# Patient Record
Sex: Female | Born: 1995 | Race: White | Hispanic: No | Marital: Single | Smoking: Never smoker
Health system: Southern US, Community
[De-identification: ages and names within clinical notes are randomized; demographics above are authoritative.]

## PROBLEM LIST (undated history)

## (undated) DIAGNOSIS — D649 Anemia, unspecified: Secondary | ICD-10-CM

## (undated) DIAGNOSIS — Z789 Other specified health status: Secondary | ICD-10-CM

---
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
## 2014-09-26 ENCOUNTER — Emergency Department: Payer: BC Managed Care – PPO

## 2014-09-26 ENCOUNTER — Encounter: Payer: Self-pay | Admitting: Emergency Medicine

## 2014-09-26 ENCOUNTER — Emergency Department
Admission: EM | Admit: 2014-09-26 | Discharge: 2014-09-26 | Disposition: A | Discharge status: Home / Self Care | Payer: BC Managed Care – PPO | Attending: Emergency Medicine | Admitting: Emergency Medicine

## 2014-09-26 DIAGNOSIS — Z79899 Other long term (current) drug therapy: Secondary | ICD-10-CM | POA: Diagnosis not present

## 2014-09-26 DIAGNOSIS — E876 Hypokalemia: Secondary | ICD-10-CM | POA: Diagnosis not present

## 2014-09-26 DIAGNOSIS — Z3202 Encounter for pregnancy test, result negative: Secondary | ICD-10-CM | POA: Insufficient documentation

## 2014-09-26 DIAGNOSIS — R1031 Right lower quadrant pain: Secondary | ICD-10-CM

## 2014-09-26 HISTORY — DX: Anemia, unspecified: D64.9

## 2014-09-26 LAB — CBC WITH DIFFERENTIAL/PLATELET
BASOS ABS: 0 10*3/uL (ref 0–0.1)
Basophils Relative: 0 %
EOS PCT: 1 %
Eosinophils Absolute: 0.2 10*3/uL (ref 0–0.7)
HEMATOCRIT: 38.4 % (ref 35.0–47.0)
HEMOGLOBIN: 12.9 g/dL (ref 12.0–16.0)
LYMPHS ABS: 6.2 10*3/uL — AB (ref 1.0–3.6)
LYMPHS PCT: 50 %
MCH: 30.8 pg (ref 26.0–34.0)
MCHC: 33.7 g/dL (ref 32.0–36.0)
MCV: 91.3 fL (ref 80.0–100.0)
Monocytes Absolute: 0.8 10*3/uL (ref 0.2–0.9)
Monocytes Relative: 7 %
NEUTROS ABS: 5.4 10*3/uL (ref 1.4–6.5)
NEUTROS PCT: 42 %
PLATELETS: 217 10*3/uL (ref 150–440)
RBC: 4.21 MIL/uL (ref 3.80–5.20)
RDW: 12.9 % (ref 11.5–14.5)
WBC: 12.6 10*3/uL — AB (ref 3.6–11.0)

## 2014-09-26 LAB — BASIC METABOLIC PANEL
ANION GAP: 7 (ref 5–15)
BUN: 9 mg/dL (ref 6–20)
CHLORIDE: 105 mmol/L (ref 101–111)
CO2: 25 mmol/L (ref 22–32)
Calcium: 8.5 mg/dL — ABNORMAL LOW (ref 8.9–10.3)
Creatinine, Ser: 0.58 mg/dL (ref 0.44–1.00)
GFR calc Af Amer: >60 mL/min (ref >60)
GLUCOSE: 142 mg/dL — AB (ref 65–99)
POTASSIUM: 3 mmol/L — AB (ref 3.5–5.1)
Sodium: 137 mmol/L (ref 135–145)

## 2014-09-26 LAB — HEPATIC FUNCTION PANEL
ALK PHOS: 56 U/L (ref 38–126)
ALT: 10 U/L — ABNORMAL LOW (ref 14–54)
AST: 22 U/L (ref 15–41)
Albumin: 4.5 g/dL (ref 3.5–5.0)
BILIRUBIN TOTAL: 0.6 mg/dL (ref 0.3–1.2)
Bilirubin, Direct: <0.1 mg/dL — ABNORMAL LOW (ref 0.1–0.5)
TOTAL PROTEIN: 7 g/dL (ref 6.5–8.1)

## 2014-09-26 LAB — URINALYSIS COMPLETE WITH MICROSCOPIC (ARMC ONLY)
BILIRUBIN URINE: NEGATIVE
Glucose, UA: NEGATIVE mg/dL
KETONES UR: NEGATIVE mg/dL
LEUKOCYTES UA: NEGATIVE
Nitrite: NEGATIVE
PH: 5 (ref 5.0–8.0)
PROTEIN: NEGATIVE mg/dL
Specific Gravity, Urine: 1.016 (ref 1.005–1.030)

## 2014-09-26 LAB — LIPASE, BLOOD: Lipase: 25 U/L (ref 22–51)

## 2014-09-26 LAB — POCT PREGNANCY, URINE: PREG TEST UR: NEGATIVE

## 2014-09-26 MED ORDER — IOHEXOL 300 MG/ML  SOLN
100.0000 mL | Freq: Once | INTRAMUSCULAR | Status: AC | PRN
  Administered 2014-09-26: 100 mL via INTRAVENOUS

## 2014-09-26 MED ORDER — IOHEXOL 240 MG/ML SOLN
25.0000 mL | INTRAMUSCULAR | Status: AC
  Administered 2014-09-26: 25 mL via ORAL

## 2014-09-26 MED ORDER — HYDROMORPHONE HCL 1 MG/ML IJ SOLN
0.5000 mg | Freq: Once | INTRAMUSCULAR | Status: AC
  Administered 2014-09-26: 0.5 mg via INTRAVENOUS
  Filled 2014-09-26: qty 1

## 2014-09-26 MED ORDER — ONDANSETRON HCL 4 MG/2ML IJ SOLN
INTRAMUSCULAR | Status: AC
Start: 2014-09-26 — End: 2014-09-26
  Administered 2014-09-26: 4 mg via INTRAVENOUS
  Filled 2014-09-26: qty 2

## 2014-09-26 MED ORDER — ONDANSETRON HCL 4 MG/2ML IJ SOLN
4.0000 mg | Freq: Once | INTRAMUSCULAR | Status: AC
  Administered 2014-09-26: 4 mg via INTRAVENOUS

## 2014-09-26 MED ORDER — SODIUM CHLORIDE 0.9 % IV BOLUS (SEPSIS)
1000.0000 mL | Freq: Once | INTRAVENOUS | Status: AC
  Administered 2014-09-26: 1000 mL via INTRAVENOUS

## 2014-09-26 MED ORDER — MORPHINE SULFATE (PF) 2 MG/ML IV SOLN
INTRAVENOUS | Status: AC
  Administered 2014-09-26: 2 mg via INTRAVENOUS
  Filled 2014-09-26: qty 1

## 2014-09-26 MED ORDER — ONDANSETRON HCL 4 MG/2ML IJ SOLN
4.0000 mg | Freq: Once | INTRAMUSCULAR | Status: AC
  Administered 2014-09-26: 4 mg via INTRAVENOUS
  Filled 2014-09-26: qty 2

## 2014-09-26 MED ORDER — MORPHINE SULFATE (PF) 2 MG/ML IV SOLN
2.0000 mg | Freq: Once | INTRAVENOUS | Status: AC
  Administered 2014-09-26: 2 mg via INTRAVENOUS

## 2014-09-26 MED ORDER — MORPHINE SULFATE (PF) 4 MG/ML IV SOLN: 4.0000 mg | Freq: Once | INTRAVENOUS | Status: DC

## 2014-09-26 NOTE — ED Notes (Signed)
Patient observed resting in bed; sleeping with even and non-labored respirations. Mother at bedside and reports that patient is "much more comfortable since you gave her that medication". Patient appears to be resting comfortable; easy to arouse to light verbal stimulation, however quickly falls back asleep; unable to report pain scale at this time due to somnolence. MD made aware. Patient pending evaluation by EDP. Will continue to monitor.

## 2014-09-26 NOTE — ED Notes (Signed)
Pt presents to ED with sudden onset of right lower abd pain since around 3am. Vomiting X1 while in ED waiting room. Pt appears pale and uncomfortable during triage.

## 2014-09-26 NOTE — ED Provider Notes (Signed)
IMPRESSION: Somewhat complex appearance of the right ovary with slight right periovarian edema. This appearance raises concern for possible early right ovarian torsion. Ultrasound of the pelvis with Doppler evaluation advised in this regard.  Prominent vascular structures are noted in the pelvis bilaterally. Question a degree of pelvic congestion syndrome.  Endometrium appears mildly prominent. Pelvic ultrasound also advised to assess endometrium.  Appendix appears normal. No bowel obstruction. No abscess. No renal or ureteral calculus. No hydronephrosis.  Oral contrast in the distal esophagus raises question of a degree of gastroesophageal reflux.  Critical Value/emergent results were called by telephone at the time of interpretation on 09/26/2014 at 8:47 am to Dr. Kennon Portela , who verbally acknowledged these results.  I assumed care from Dr. Dolores Frame at approximately 8:15 AM.  ----------------------------------------- 8:53 AM on 09/26/2014 -----------------------------------------  On reevaluation the patient reports she has no pain or symptoms currently. I did discuss with her and her mother concerns for possible twisting of the ovary as based on CT, and I have ordered an emergent and discussed with ultrasound and an emergent ultrasound to evaluate for torsion of the right ovary. It is reassuring that her pain is improved now, but we still will require further evaluation for possible torsion. The patient does report she had rather sudden onset severe pain in the right lower quadrant starting at 3 AM, and this history is concerning for possibility of acute torsion. She denies any fevers or chills. No chest pain. No shortness of breath. She states she is currently not in a pain. No vaginal bleeding or discharge.  CLINICAL DATA: Abdominal pain, vomiting, right lower quadrant pain, evaluate for ovarian torsion.  EXAM: TRANSABDOMINAL ULTRASOUND OF PELVIS  DOPPLER ULTRASOUND OF  OVARIES  TECHNIQUE: Transabdominal ultrasound examination of the pelvis was performed including evaluation of the uterus, ovaries, adnexal regions, and pelvic cul-de-sac.  Color and duplex Doppler ultrasound was utilized to evaluate blood flow to the ovaries.  COMPARISON: CT abdomen pelvis 09/26/2014.  FINDINGS: Uterus  Measurements: 7.7 x 3.5 x 5.3 cm. No fibroids or other mass visualized.  Endometrium  Thickness: 11 mm. No focal abnormality visualized.  Right ovary  Measurements: 4.0 x 2.0 x 2.7 cm. Normal appearance/no adnexal mass.  Left ovary  Measurements: 2.5 x 1.7 x 2.3 cm. Normal appearance/no adnexal mass.  Pulsed Doppler evaluation demonstrates normal low-resistance arterial and venous waveforms in both ovaries.  IMPRESSION: Normal exam. No evidence of ovarian torsion.   ----------------------------------------- 12:00 PM on 09/26/2014 -----------------------------------------  Discussed with Dr. Chauncey Cruel about 11 AM the patient's ultrasound findings, and there is no evidence of torsion. The patient is currently pain and symptom free. After discussion with Dr. Chauncey Cruel, and reevaluation of patient who is asymptomatic we will discharge her to home with careful return precautions discussed with her and her mother. We will plan to follow-up with OB, return emergency room right away should her symptoms recur, she develops any fevers, severe pain, vaginal bleeding or other new concerns arise.   Sharyn Creamer, MD 09/26/14 [phone]

## 2014-09-26 NOTE — ED Provider Notes (Signed)
[facility] Emergency Department Provider Note  ____________________________________________  Time seen: Approximately 4:37 AM  I have reviewed the triage vital signs and the nursing notes.   HISTORY  Chief Complaint Abdominal Pain and Vomiting    HPI Carrie Weber is a 19 y.o. female who presents to the ED from home with a chief complaint of abdominal pain. Patient awoke with sudden onset of 10/10, sharp, nonradiating right lower abdominal pain approximately 3 AM. Symptoms associated with vomiting 1. Patient states she was in her usual good state of baseline health today and when going to bed.Denies recent fevers, chills, chest pain, shortness of breath, diarrhea, dysuria, vaginal discharge. Denies recent travel. Denies history of nephrolithiasis or ovarian cysts. Last menstrual period one week ago.   Past Medical History  Diagnosis Date  . Vegetarian diet   . Anemia     There are no active problems to display for this patient.   History reviewed. No pertinent past surgical history.  Current Outpatient Rx  Name  Route  Sig  Dispense  Refill  . IRON PO   Oral   Take 500 mg by mouth daily.           . ondansetron (ZOFRAN ODT) 4 MG disintegrating tablet   Oral   Take 1 tablet (4 mg total) by mouth every 8 (eight) hours as needed for nausea.   20 tablet   0     Allergies Review of patient's allergies indicates no known allergies.  History reviewed. No pertinent family history.  Social History Social History  Substance Use Topics  . Smoking status: Never Smoker   . Smokeless tobacco: Never Used  . Alcohol Use: No    Review of Systems Constitutional: No fever/chills Eyes: No visual changes. ENT: No sore throat. Cardiovascular: Denies chest pain. Respiratory: Denies shortness of breath. Gastrointestinal: Positive for abdominal pain.  Vomited x1.  No diarrhea.  No constipation. Genitourinary: Negative for dysuria. Musculoskeletal:  Negative for back pain. Skin: Negative for rash. Neurological: Negative for headaches, focal weakness or numbness.  10-point ROS otherwise negative.  ____________________________________________   PHYSICAL EXAM:  VITAL SIGNS: ED Triage Vitals  Enc Vitals Group     BP 09/26/14 0428 124/86 mmHg     Pulse Rate 09/26/14 0428 69     Resp 09/26/14 0428 20     Temp 09/26/14 0428 98.2 F (36.8 C)     Temp Source 09/26/14 0428 Oral     SpO2 09/26/14 0428 100 %     Weight 09/26/14 0428 105 lb (47.628 kg)     Height 09/26/14 0428 5\' 7"  (1.702 m)     Head Cir --      Peak Flow --      Pain Score 09/26/14 0429 8     Pain Loc --      Pain Edu? --      Excl. in GC? --     Constitutional: Alert and oriented. Thin, uncomfortable-appearing and in mild acute distress. Eyes: Conjunctivae are normal. PERRL. EOMI. Head: Atraumatic. Nose: No congestion/rhinnorhea. Mouth/Throat: Mucous membranes are moist.  Oropharynx non-erythematous. Neck: No stridor.   Cardiovascular: Normal rate, regular rhythm. Grossly normal heart sounds.  Good peripheral circulation. Respiratory: Normal respiratory effort.  No retractions. Lungs CTAB. Gastrointestinal: Soft, mild tenderness to palpation right lower quadrant without rebound or guarding. No distention. No abdominal bruits. No CVA tenderness. Musculoskeletal: No lower extremity tenderness nor edema.  No joint effusions. Neurologic:  Normal speech and language. No  gross focal neurologic deficits are appreciated. No gait instability. Skin:  Skin is warm, dry and intact. No rash noted. Psychiatric: Mood and affect are normal. Speech and behavior are normal.  ____________________________________________   LABS (all labs ordered are listed, but only abnormal results are displayed)  Labs Reviewed  BASIC METABOLIC PANEL - Abnormal; Notable for the following:    Potassium 3.0 (*)    Glucose, Bld 142 (*)    Calcium 8.5 (*)    All other components within  normal limits  CBC WITH DIFFERENTIAL/PLATELET  URINALYSIS COMPLETEWITH MICROSCOPIC ([facility] ONLY)  POC URINE PREG, ED   ____________________________________________  EKG  None ____________________________________________  RADIOLOGY  CT Abdomen/Pelvis pending. ____________________________________________   PROCEDURES  Procedure(s) performed: None  Critical Care performed: No  ____________________________________________   INITIAL IMPRESSION / ASSESSMENT AND PLAN / ED COURSE  Pertinent labs & imaging results that were available during my care of the patient were reviewed by me and considered in my medical decision making (see chart for details).  19 year old female who presents with sudden onset right lower quadrant pain who was restless and very uncomfortable appearing on arrival. She was medicated immediately with IV analgesia and is currently resting in no acute distress. Sudden onset of symptoms suggests nephrolithiasis; however, patient denies flank pain. She denies pelvic pain complaints and vaginal discharge. Given that patient is tender to palpation in the right lower quadrant, we will proceed with CT abdomen/pelvis with contrast to evaluate inflammatory/infectious etiology of patient's pain.  ----------------------------------------- 7:12 AM on 09/26/2014 -----------------------------------------  Awaiting CT scan. Care transferred to Dr. Fanny Bien. Disposition pending CT scan. ____________________________________________   FINAL CLINICAL IMPRESSION(S) / ED DIAGNOSES  Final diagnoses:  Right lower quadrant abdominal pain  Hypokalemia      Irean Hong, MD 09/26/14 [phone]

## 2014-09-26 NOTE — ED Notes (Signed)
RN returns to room for patient reassessment s/p Morphine. Patient sleeping with even and non-labored respirations noted. Mother reports that the intervention has "seemed to help again". Patient with no obvious s/s of discomfort at this time. No anticipated needed identified by this RN or verbalized by mother. Will continue to monitor.

## 2014-09-26 NOTE — ED Notes (Signed)
PO contrast volume to room at this time per CT tech.

## 2014-09-26 NOTE — Discharge Instructions (Signed)
Abdominal Pain, Women °Abdominal (stomach, pelvic, or belly) pain can be caused by many things. It is important to tell your doctor: °· The location of the pain. °· Does it come and go or is it present all the time? °· Are there things that start the pain (eating certain foods, exercise)? °· Are there other symptoms associated with the pain (fever, nausea, vomiting, diarrhea)? °All of this is helpful to know when trying to find the cause of the pain. °CAUSES  °· Stomach: virus or bacteria infection, or ulcer. °· Intestine: appendicitis (inflamed appendix), regional ileitis (Crohn's disease), ulcerative colitis (inflamed colon), irritable bowel syndrome, diverticulitis (inflamed diverticulum of the colon), or cancer of the stomach or intestine. °· Gallbladder disease or stones in the gallbladder. °· Kidney disease, kidney stones, or infection. °· Pancreas infection or cancer. °· Fibromyalgia (pain disorder). °· Diseases of the female organs: °¨ Uterus: fibroid (non-cancerous) tumors or infection. °¨ Fallopian tubes: infection or tubal pregnancy. °¨ Ovary: cysts or tumors. °¨ Pelvic adhesions (scar tissue). °¨ Endometriosis (uterus lining tissue growing in the pelvis and on the pelvic organs). °¨ Pelvic congestion syndrome (female organs filling up with blood just before the menstrual period). °¨ Pain with the menstrual period. °¨ Pain with ovulation (producing an egg). °¨ Pain with an IUD (intrauterine device, birth control) in the uterus. °¨ Cancer of the female organs. °· Functional pain (pain not caused by a disease, may improve without treatment). °· Psychological pain. °· Depression. °DIAGNOSIS  °Your doctor will decide the seriousness of your pain by doing an examination. °· Blood tests. °· X-rays. °· Ultrasound. °· CT scan (computed tomography, special type of X-ray). °· MRI (magnetic resonance imaging). °· Cultures, for infection. °· Barium enema (dye inserted in the large intestine, to better view it with  X-rays). °· Colonoscopy (looking in intestine with a lighted tube). °· Laparoscopy (minor surgery, looking in abdomen with a lighted tube). °· Major abdominal exploratory surgery (looking in abdomen with a large incision). °TREATMENT  °The treatment will depend on the cause of the pain.  °· Many cases can be observed and treated at home. °· Over-the-counter medicines recommended by your caregiver. °· Prescription medicine. °· Antibiotics, for infection. °· Birth control pills, for painful periods or for ovulation pain. °· Hormone treatment, for endometriosis. °· Nerve blocking injections. °· Physical therapy. °· Antidepressants. °· Counseling with a psychologist or psychiatrist. °· Minor or major surgery. °HOME CARE INSTRUCTIONS  °· Do not take laxatives, unless directed by your caregiver. °· Take over-the-counter pain medicine only if ordered by your caregiver. Do not take aspirin because it can cause an upset stomach or bleeding. °· Try a clear liquid diet (broth or water) as ordered by your caregiver. Slowly move to a bland diet, as tolerated, if the pain is related to the stomach or intestine. °· Have a thermometer and take your temperature several times a day, and record it. °· Bed rest and sleep, if it helps the pain. °· Avoid sexual intercourse, if it causes pain. °· Avoid stressful situations. °· Keep your follow-up appointments and tests, as your caregiver orders. °· If the pain does not go away with medicine or surgery, you may try: °¨ Acupuncture. °¨ Relaxation exercises (yoga, meditation). °¨ Group therapy. °¨ Counseling. °SEEK MEDICAL CARE IF:  °· You notice certain foods cause stomach pain. °· Your home care treatment is not helping your pain. °· You need stronger pain medicine. °· You want your IUD removed. °· You feel faint or   lightheaded. °· You develop nausea and vomiting. °· You develop a rash. °· You are having side effects or an allergy to your medicine. °SEEK IMMEDIATE MEDICAL CARE IF:  °· Your  pain does not go away or gets worse. °· You have a fever. °· Your pain is felt only in portions of the abdomen. The right side could possibly be appendicitis. The left lower portion of the abdomen could be colitis or diverticulitis. °· You are passing blood in your stools (bright red or black tarry stools, with or without vomiting). °· You have blood in your urine. °· You develop chills, with or without a fever. °· You pass out. °MAKE SURE YOU:  °· Understand these instructions. °· Will watch your condition. °· Will get help right away if you are not doing well or get worse. °Document Released: 11/09/2006 Document Revised: 05/29/2013 Document Reviewed: 11/29/2008 °[employer]® Patient Information ©2015 [employer]. This information is not intended to replace advice given to you by your health care provider. Make sure you discuss any questions you have with your health care provider. ° °

## 2016-12-07 IMAGING — CT CT ABD-PELV W/ CM
1 of 2 series · 14 of 32 positions shown, 18 images · IV contrast (omnipaque)
Comparison: None.

CLINICAL DATA: Right lower quadrant pain with nausea and vomiting
for 1 day

EXAM:
CT ABDOMEN AND PELVIS WITH CONTRAST
TECHNIQUE: Multidetector CT imaging of the abdomen and pelvis was performed
using the standard protocol following bolus administration of
intravenous contrast. Oral contrast was also administered.
CONTRAST:  100mL OMNIPAQUE IOHEXOL 300 MG/ML SOLN, 1 OMNIPAQUE
IOHEXOL 240 MG/ML SOLN

[Series 2: routine abd pel with · axial · 0.61mm/px · z∈[+373,+773]mm · 14 of 92 slices shown, 18 images]
[im 8/92  soft-tissue]
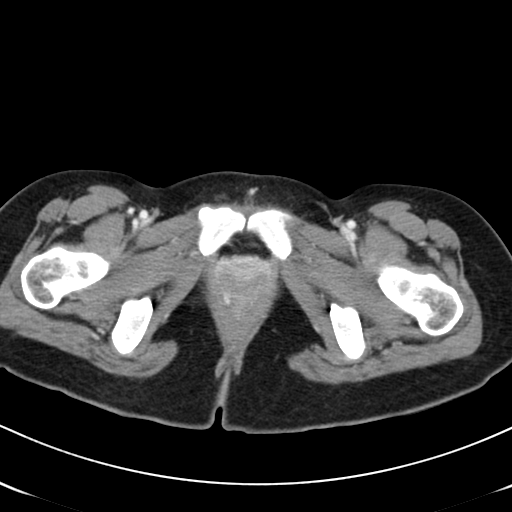
[im 8/92  bone]
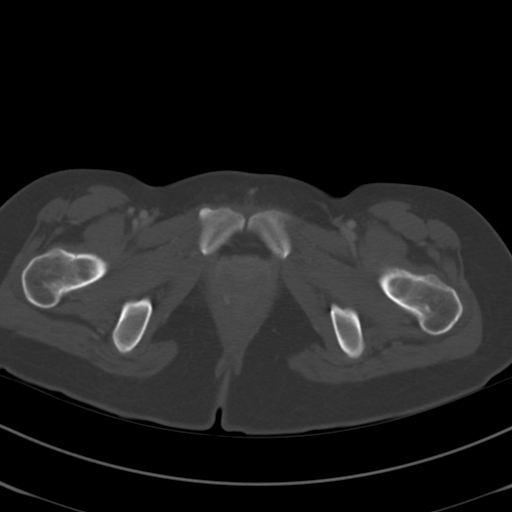
[im 15/92  soft-tissue]
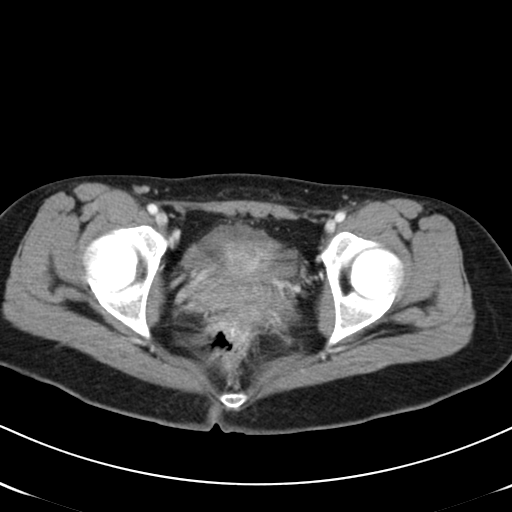
[im 22/92  soft-tissue]
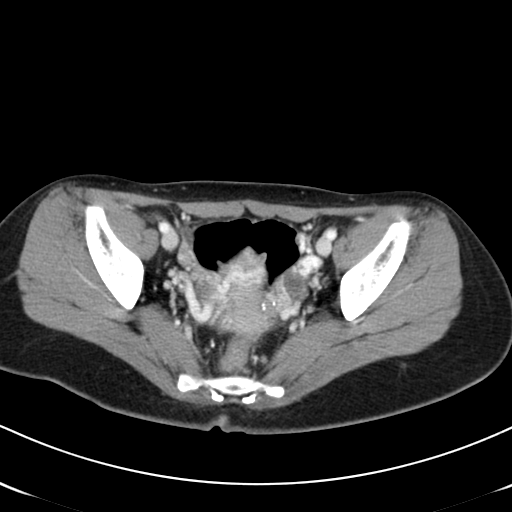
[im 29/92  soft-tissue]
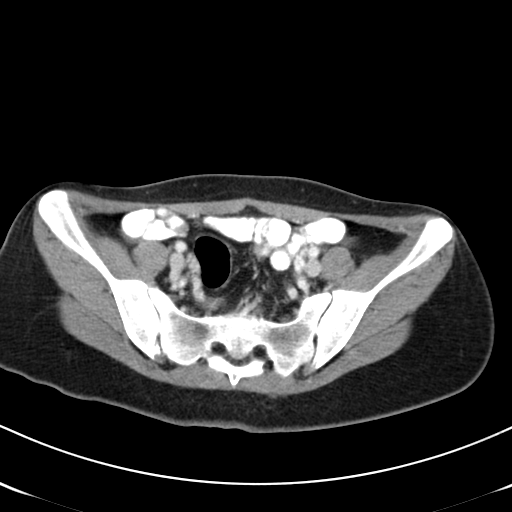
[im 36/92  soft-tissue]
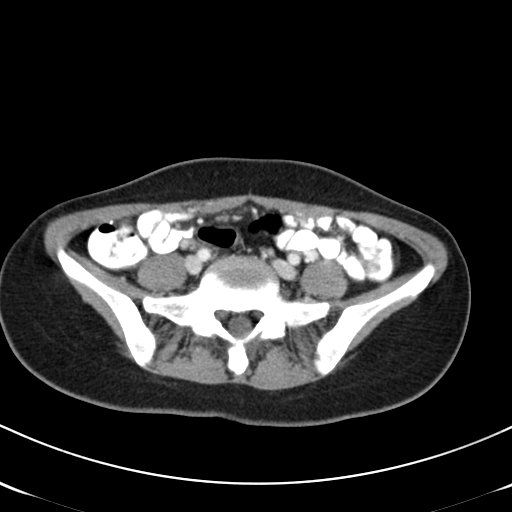
[im 43/92  soft-tissue]
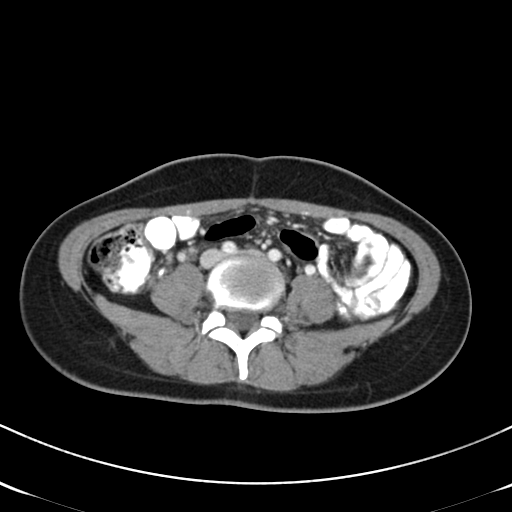
[im 50/92  soft-tissue]
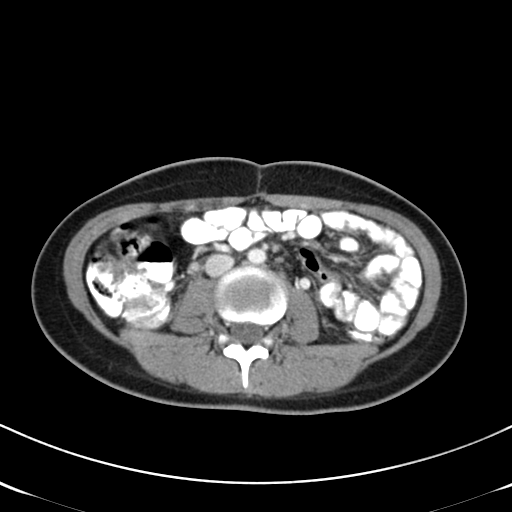
[im 57/92  soft-tissue]
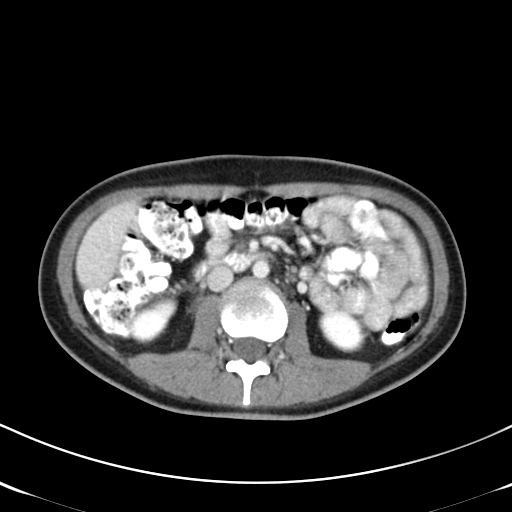
[im 64/92  soft-tissue]
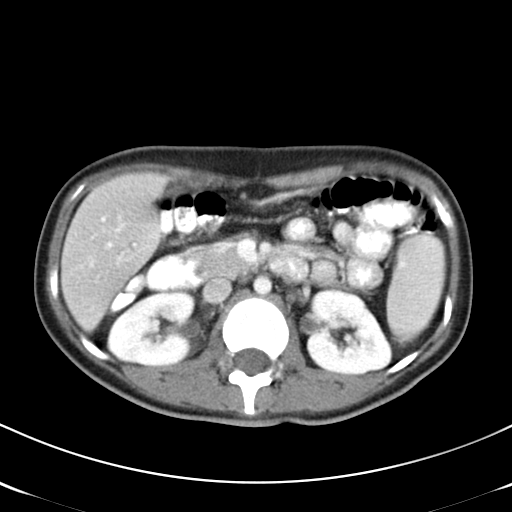
[im 64/92  bone]
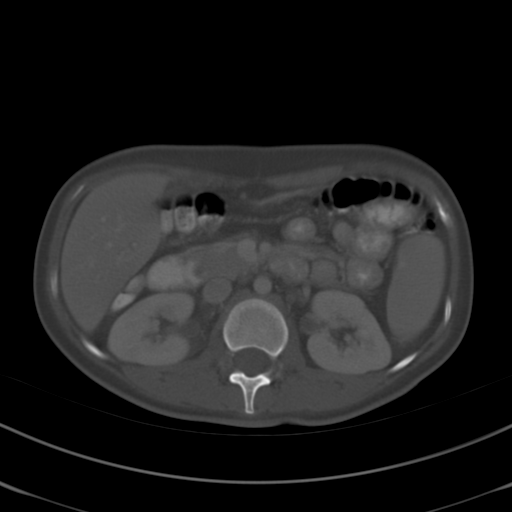
[im 71/92  soft-tissue]
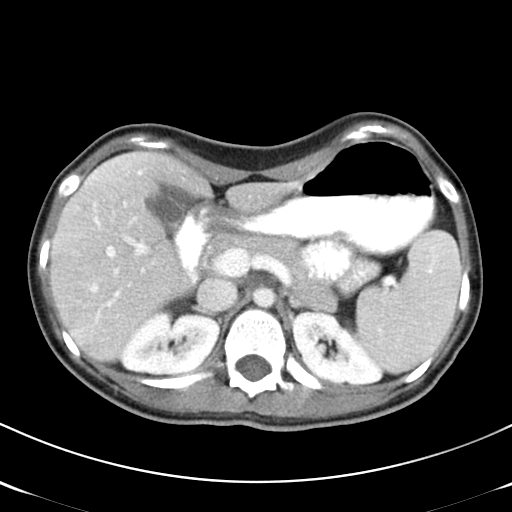
[im 78/92  soft-tissue]
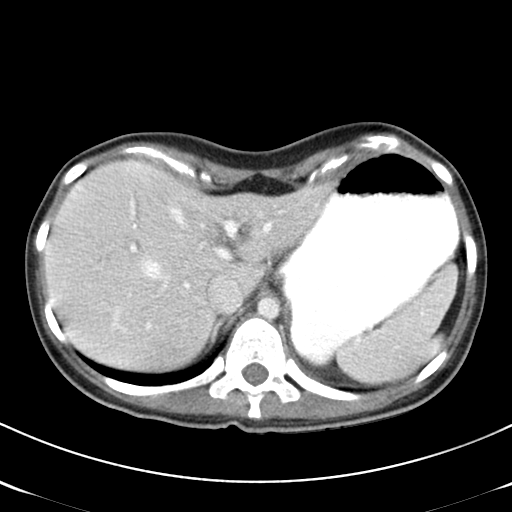
[im 78/92  lung]
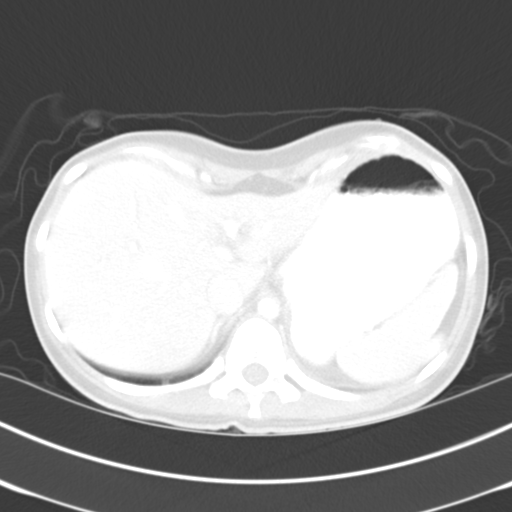
[im 81/92  lung]
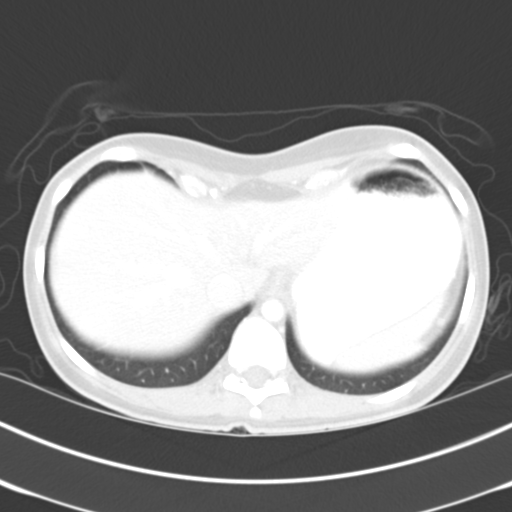
[im 85/92  soft-tissue]
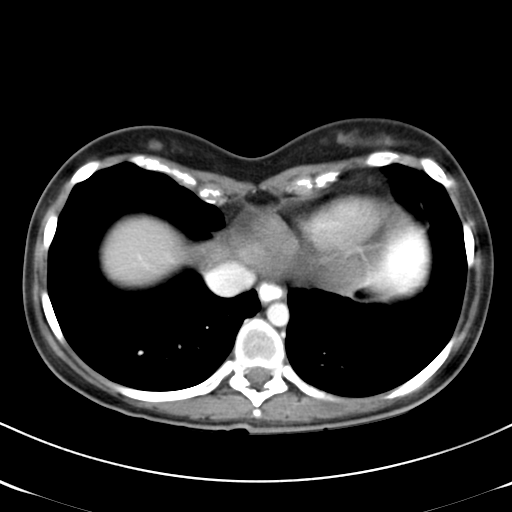
[im 85/92  lung]
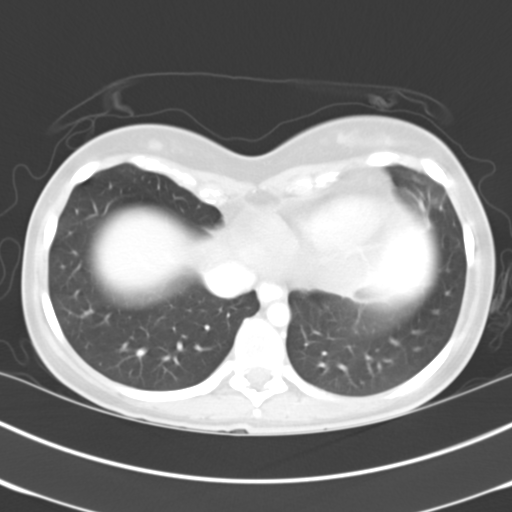
[im 88/92  lung]
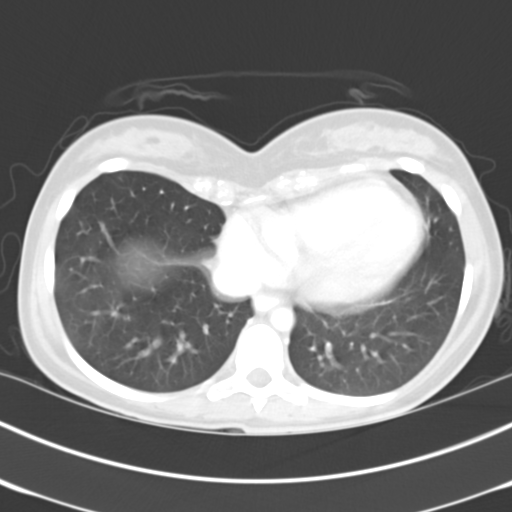

[14 of 32 positions shown; findings below may reference images not displayed]

FINDINGS: Lung bases are clear. There appears to be a degree of pectus
excavatum. A small amount of oral contrast is noted in the distal
esophagus.

No focal liver lesions are identified. The gallbladder wall is not
appreciably thickened. There is no biliary duct dilatation.

Spleen, pancreas, and adrenals appear normal.

Kidneys bilaterally show no mass or hydronephrosis on either side.
There is no renal or ureteral calculus on either side.

In the pelvis, urinary bladder is midline with normal wall
thickness. The endometrium appears mildly thickened. There are
multiple prominent pelvic vascular structures on both sides. There
is mixed attenuation within the right ovary. There is questionable
mild periovarian edema on the right. A well-defined dominant mass is
not seen. Mild enhancement of the periphery of a cystic structure in
the right ovary may represent mild hemorrhagic corpus luteum.

The appendix appears within normal limits.

There is no bowel obstruction. No free air or portal venous air.
There is no appreciable ascites, adenopathy, or abscess in the
abdomen or pelvis. No abdominal aortic aneurysm. There are no
blastic or lytic bone lesions.
IMPRESSION: Somewhat complex appearance of the right ovary with slight right
periovarian edema. This appearance raises concern for possible early
right ovarian torsion. Ultrasound of the pelvis with Doppler
evaluation advised in this regard.

Prominent vascular structures are noted in the pelvis bilaterally.
Question a degree of pelvic congestion syndrome.

Endometrium appears mildly prominent. Pelvic ultrasound also advised
to assess endometrium.

Appendix appears normal. No bowel obstruction. No abscess. No renal
or ureteral calculus. No hydronephrosis.

Oral contrast in the distal esophagus raises question of a degree of
gastroesophageal reflux.

Critical Value/emergent results were called by telephone at the time
of interpretation on 09/26/2014 at [DATE] to Dr. Lebrens Yousra , who
verbally acknowledged these results.

## 2018-08-10 ENCOUNTER — Other Ambulatory Visit: Payer: Self-pay

## 2018-08-10 ENCOUNTER — Other Ambulatory Visit: Payer: BC Managed Care – PPO

## 2018-08-10 DIAGNOSIS — Z20822 Contact with and (suspected) exposure to covid-19: Secondary | ICD-10-CM

## 2018-08-13 LAB — NOVEL CORONAVIRUS, NAA: SARS-CoV-2, NAA: NOT DETECTED
